# Patient Record
Sex: Female | Born: 1985 | Race: Black or African American | Hispanic: No | Marital: Single
Health system: Southern US, Community
[De-identification: ages and names within clinical notes are randomized; demographics above are authoritative.]

---

## 2020-04-26 ENCOUNTER — Other Ambulatory Visit (HOSPITAL_COMMUNITY)
Admission: RE | Admit: 2020-04-26 | Discharge: 2020-04-26 | Disposition: A | Discharge status: Home / Self Care | Payer: HRSA Program | Source: Ambulatory Visit | Attending: General Surgery | Admitting: General Surgery

## 2020-04-26 DIAGNOSIS — Z20822 Contact with and (suspected) exposure to covid-19: Secondary | ICD-10-CM | POA: Diagnosis present

## 2020-04-26 DIAGNOSIS — U071 COVID-19: Secondary | ICD-10-CM | POA: Diagnosis not present

## 2020-04-26 LAB — SARS CORONAVIRUS 2 BY RT PCR (HOSPITAL ORDER, PERFORMED IN ~~LOC~~ HOSPITAL LAB): SARS Coronavirus 2: POSITIVE — AB

## 2020-10-05 ENCOUNTER — Ambulatory Visit: Payer: Self-pay | Attending: Internal Medicine

## 2020-10-05 ENCOUNTER — Other Ambulatory Visit (HOSPITAL_COMMUNITY): Payer: Self-pay | Admitting: Internal Medicine

## 2020-10-05 ENCOUNTER — Other Ambulatory Visit: Payer: Self-pay

## 2020-10-05 DIAGNOSIS — Z23 Encounter for immunization: Secondary | ICD-10-CM

## 2020-10-05 NOTE — Progress Notes (Signed)
   [facility]  Name:  Jillian Frost    MRN: [id number] DOB: [birth year]  10/05/2020  Jillian Frost was observed post Covid-19 immunization for 15 minutes without incident. She was provided with Vaccine Information Sheet and instruction to access the V-Safe system.   Jillian Frost was instructed to call 911 with any severe reactions post vaccine: Marland Kitchen Difficulty breathing  . Swelling of face and throat  . A fast heartbeat  . A bad rash all over body  . Dizziness and weakness   Immunizations Administered    Name Date Dose VIS Date Route   Moderna Covid-19 Booster Vaccine 10/05/2020  1:20 PM 0.25 mL 07/13/2020 Intramuscular   Manufacturer: Gala Murdoch   Lot: [id number]   NDC: 39767-341-93

## 2020-10-12 ENCOUNTER — Ambulatory Visit: Payer: Self-pay

## 2020-11-04 ENCOUNTER — Ambulatory Visit
Admission: RE | Admit: 2020-11-04 | Discharge: 2020-11-04 | Disposition: A | Discharge status: Home / Self Care | Payer: BC Managed Care – PPO | Source: Ambulatory Visit | Attending: Obstetrics & Gynecology | Admitting: Obstetrics & Gynecology

## 2020-11-04 ENCOUNTER — Other Ambulatory Visit: Payer: Self-pay | Admitting: Obstetrics & Gynecology

## 2020-11-04 ENCOUNTER — Other Ambulatory Visit: Payer: Self-pay

## 2020-11-04 DIAGNOSIS — Z52819 Egg (Oocyte) donor, unspecified: Secondary | ICD-10-CM

## 2022-05-24 IMAGING — US US TRANSVAGINAL NON-OB
2 series · 15 of 25 positions shown · non-contrast
Comparison: None.

CLINICAL DATA: Baseline prior to a egg harvesting.

EXAM:
ULTRASOUND PELVIS TRANSVAGINAL
TECHNIQUE: Transvaginal ultrasound examination of the pelvis was performed
including evaluation of the uterus, ovaries, adnexal regions, and
pelvic cul-de-sac.

[Series 1: us transvaginal non-ob · 7 of 32 slices shown (1 of 2)]
[im 1/32]
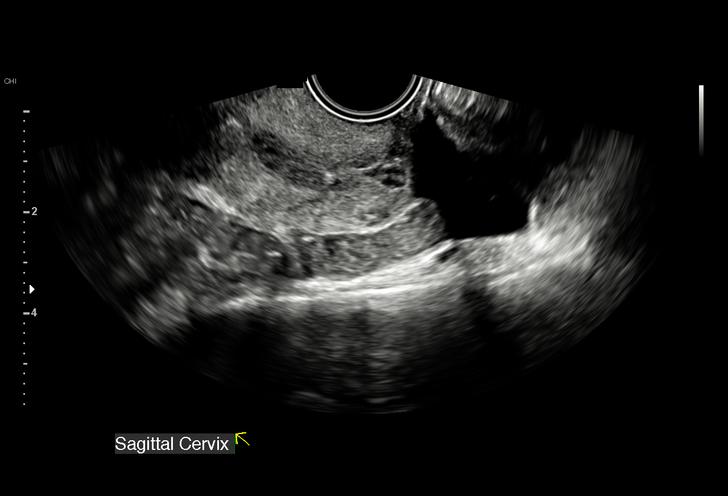
[im 6/32]
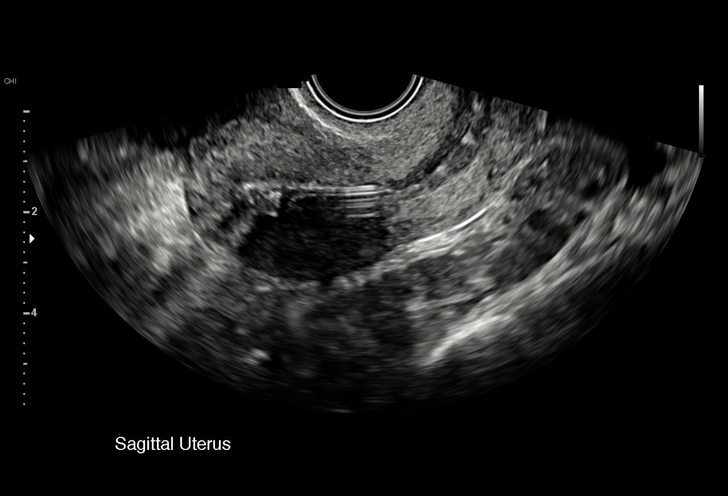
[im 12/32]
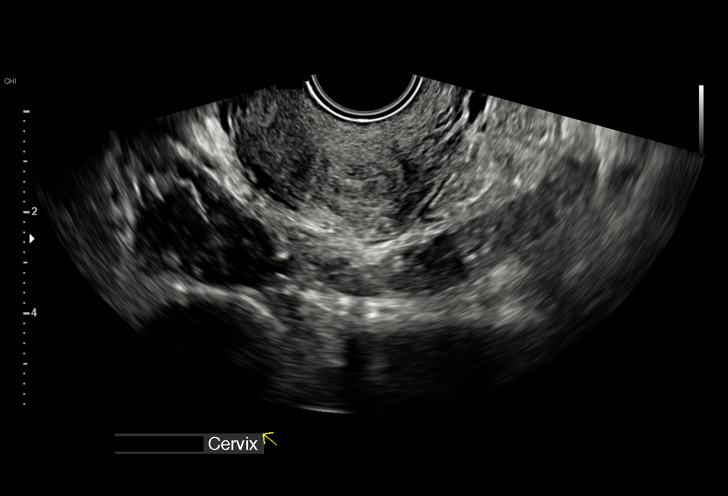
[im 15/32]
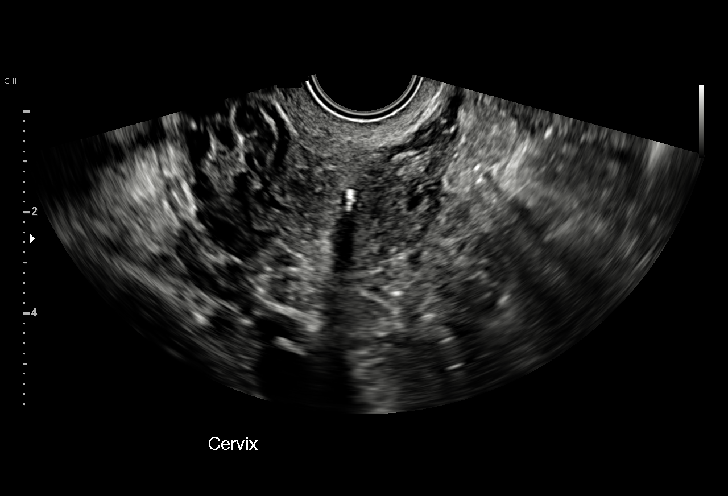
[im 20/32]
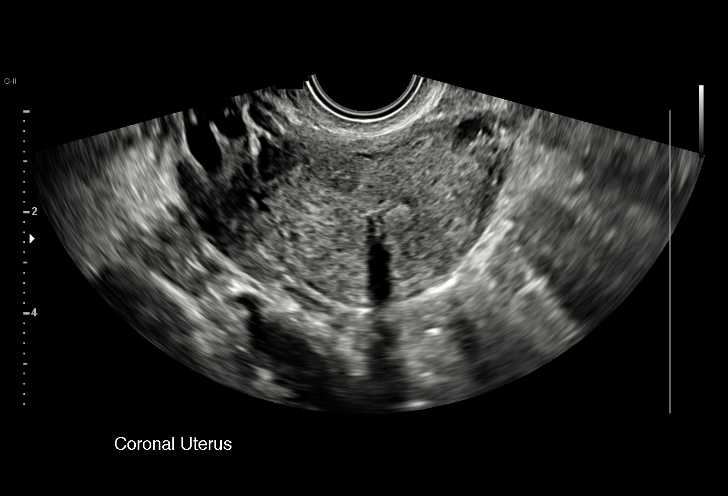
[im 26/32]
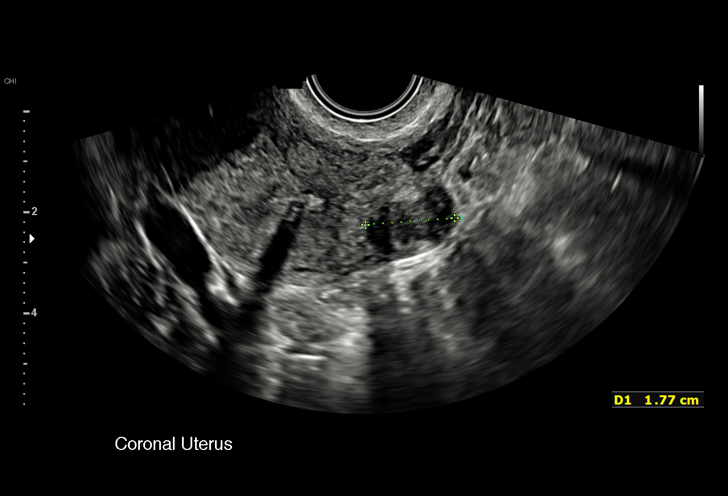
[im 29/32]
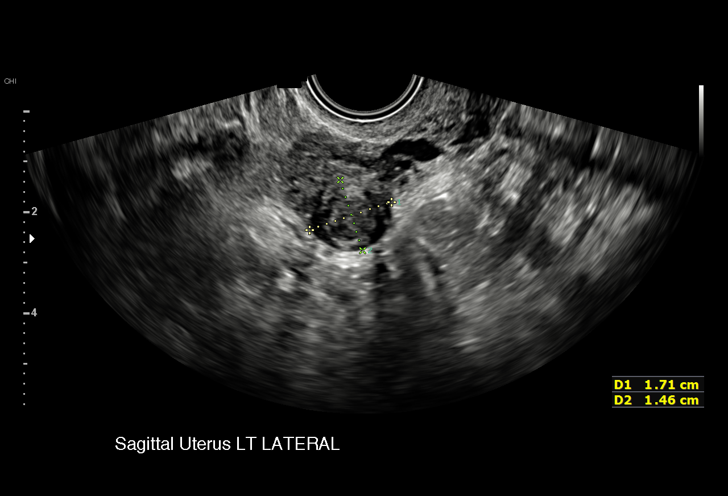

[Series 2: us transvaginal non-ob · 8 of 36 slices shown (2 of 2)]
[im 1/36]
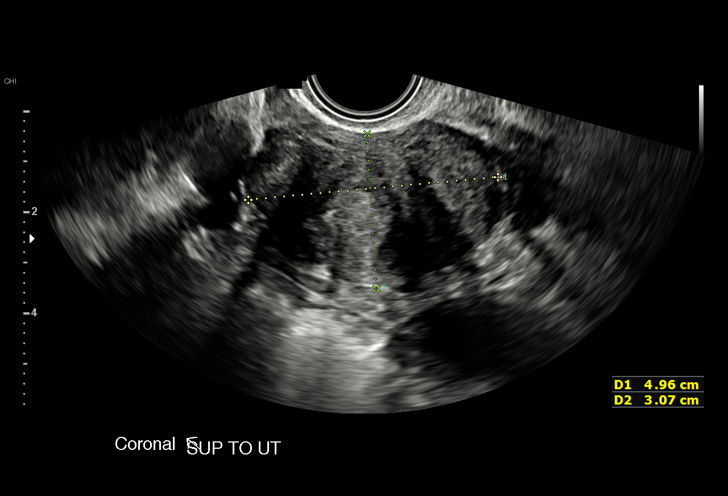
[im 6/36]
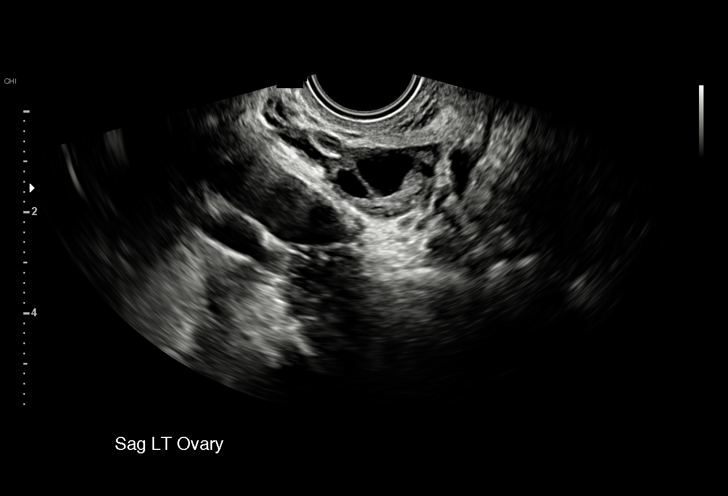
[im 9/36]
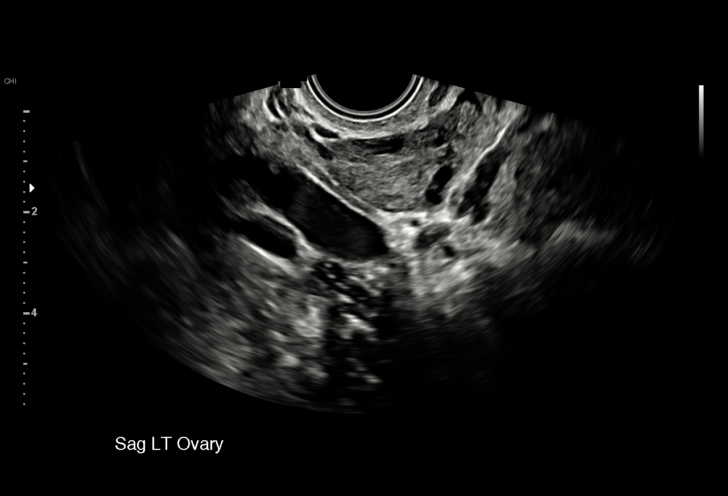
[im 15/36]
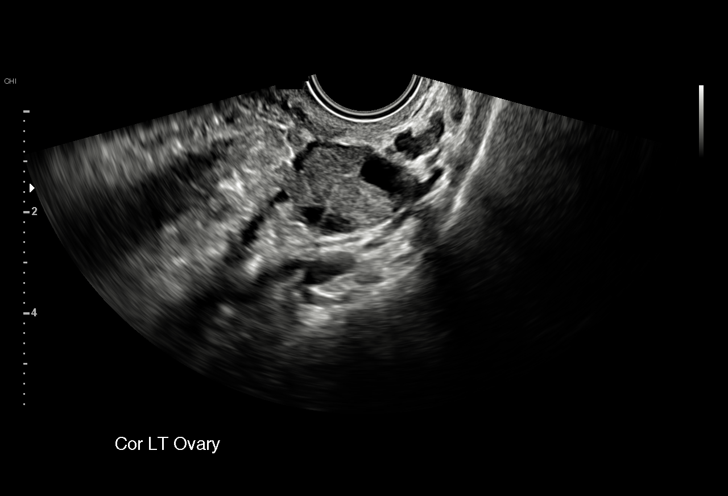
[im 21/36]
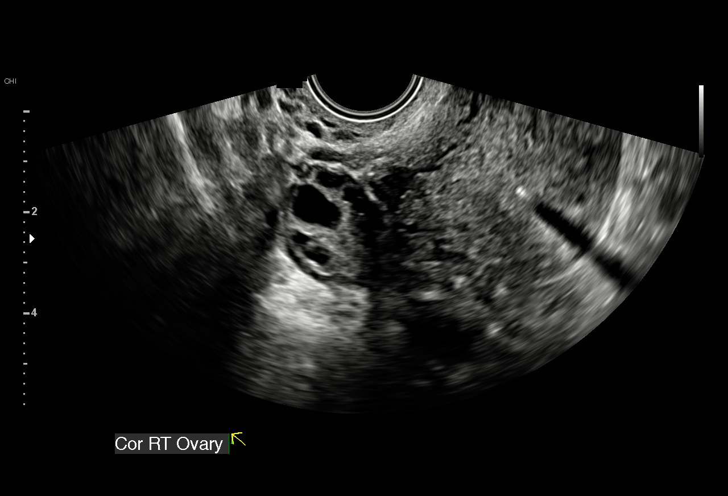
[im 24/36]
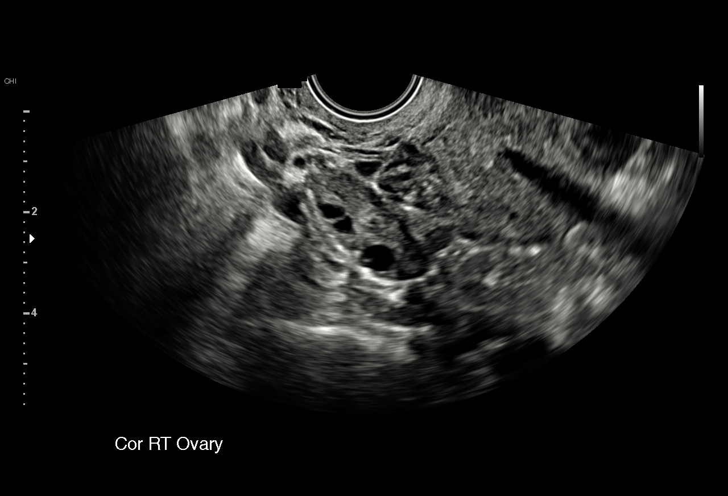
[im 30/36]
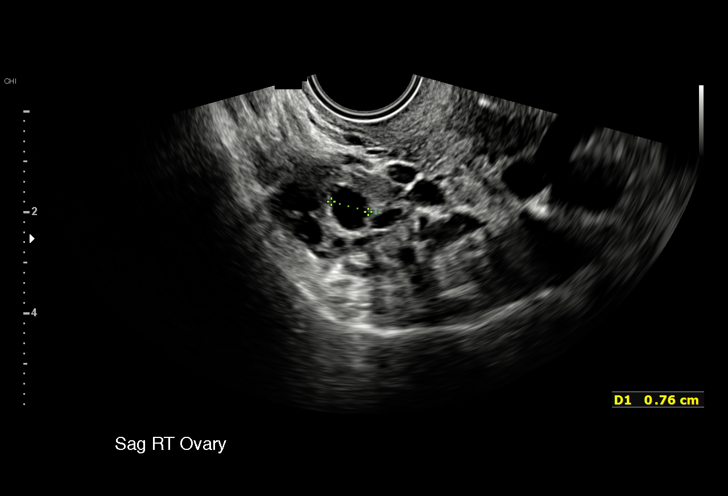
[im 36/36]
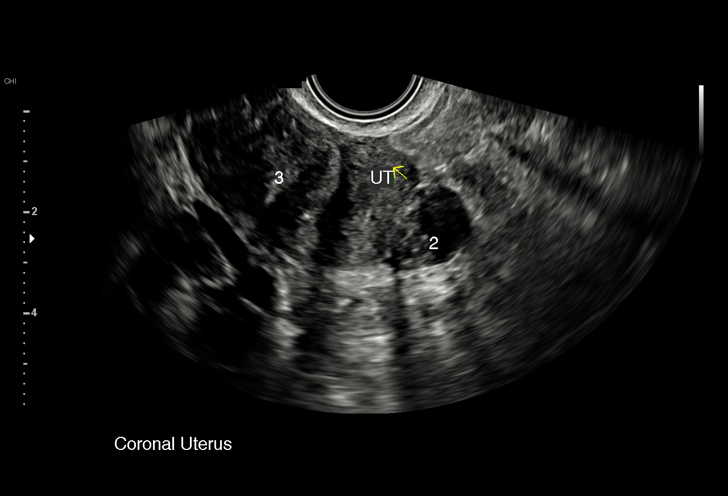

[15 of 25 positions shown; findings below may reference images not displayed]

FINDINGS: Uterus

Measurements: 7.3 x 3.7 x 4.6 cm = volume: 65 mL. Fibroids are noted
including a 3.1 by 3.4 x 5 cm fibroid arising from the uterine
fundus. This fibroid appears to be subserosal versus pedunculated.
The remaining fibroids appear to be subserosal in location.

Endometrium

Thickness: There is an IUD in place which limits evaluation of the
endometrial thickness.

Right ovary

Measurements: 4 x 2.1 x 2 cm = volume: 8.8 mL. Normal appearance/no
adnexal mass.

Left ovary

Measurements: 2.8 x 1.8 x 2.3 cm = volume: 6.1 mL. Normal
appearance/no adnexal mass.

Other findings:  There is a trace volume of pelvic free fluid.
IMPRESSION: 1. No acute abnormality.
2. Multiple uterine fibroids are noted measuring up to approximately
5 cm.
3. Grossly well-positioned IUD.

## 2024-08-21 ENCOUNTER — Other Ambulatory Visit: Payer: Self-pay | Admitting: Family Medicine

## 2024-08-21 DIAGNOSIS — R3989 Other symptoms and signs involving the genitourinary system: Secondary | ICD-10-CM

## 2024-08-21 MED ORDER — AMOXICILLIN-POT CLAVULANATE 500-125 MG PO TABS: 1.0000 | ORAL_TABLET | Freq: Two times a day (BID) | ORAL | 0 refills | Status: AC
